# Patient Record
Sex: Male | Born: 1980 | Race: White | Hispanic: No | Marital: Single
Health system: Southern US, Community
[De-identification: ages and names within clinical notes are randomized; demographics above are authoritative.]

---

## 2003-08-17 ENCOUNTER — Encounter: Admission: RE | Admit: 2003-08-17 | Discharge: 2003-08-17 | Payer: Self-pay | Admitting: Family Medicine

## 2004-08-06 ENCOUNTER — Encounter: Admission: RE | Admit: 2004-08-06 | Discharge: 2004-08-06 | Payer: Self-pay | Admitting: Family Medicine

## 2006-03-10 IMAGING — CR DG FOOT COMPLETE 3+V*R*
3 series · 3 of 3 positions shown · non-contrast
Comparison: none

CLINICAL DATA: Pain in right foot.  Status post injury.  Swelling over the 3rd, 4th, and 5th metatarsals. 
 RIGHT FOOT COMPLETE: 
 Three views of the right foot show no definite fracture, dislocation, or foreign body.  There is soft tissue swelling seen over the region of the 4th and 5th mid and distal metatarsals.

[view not recorded (1 of 3)]
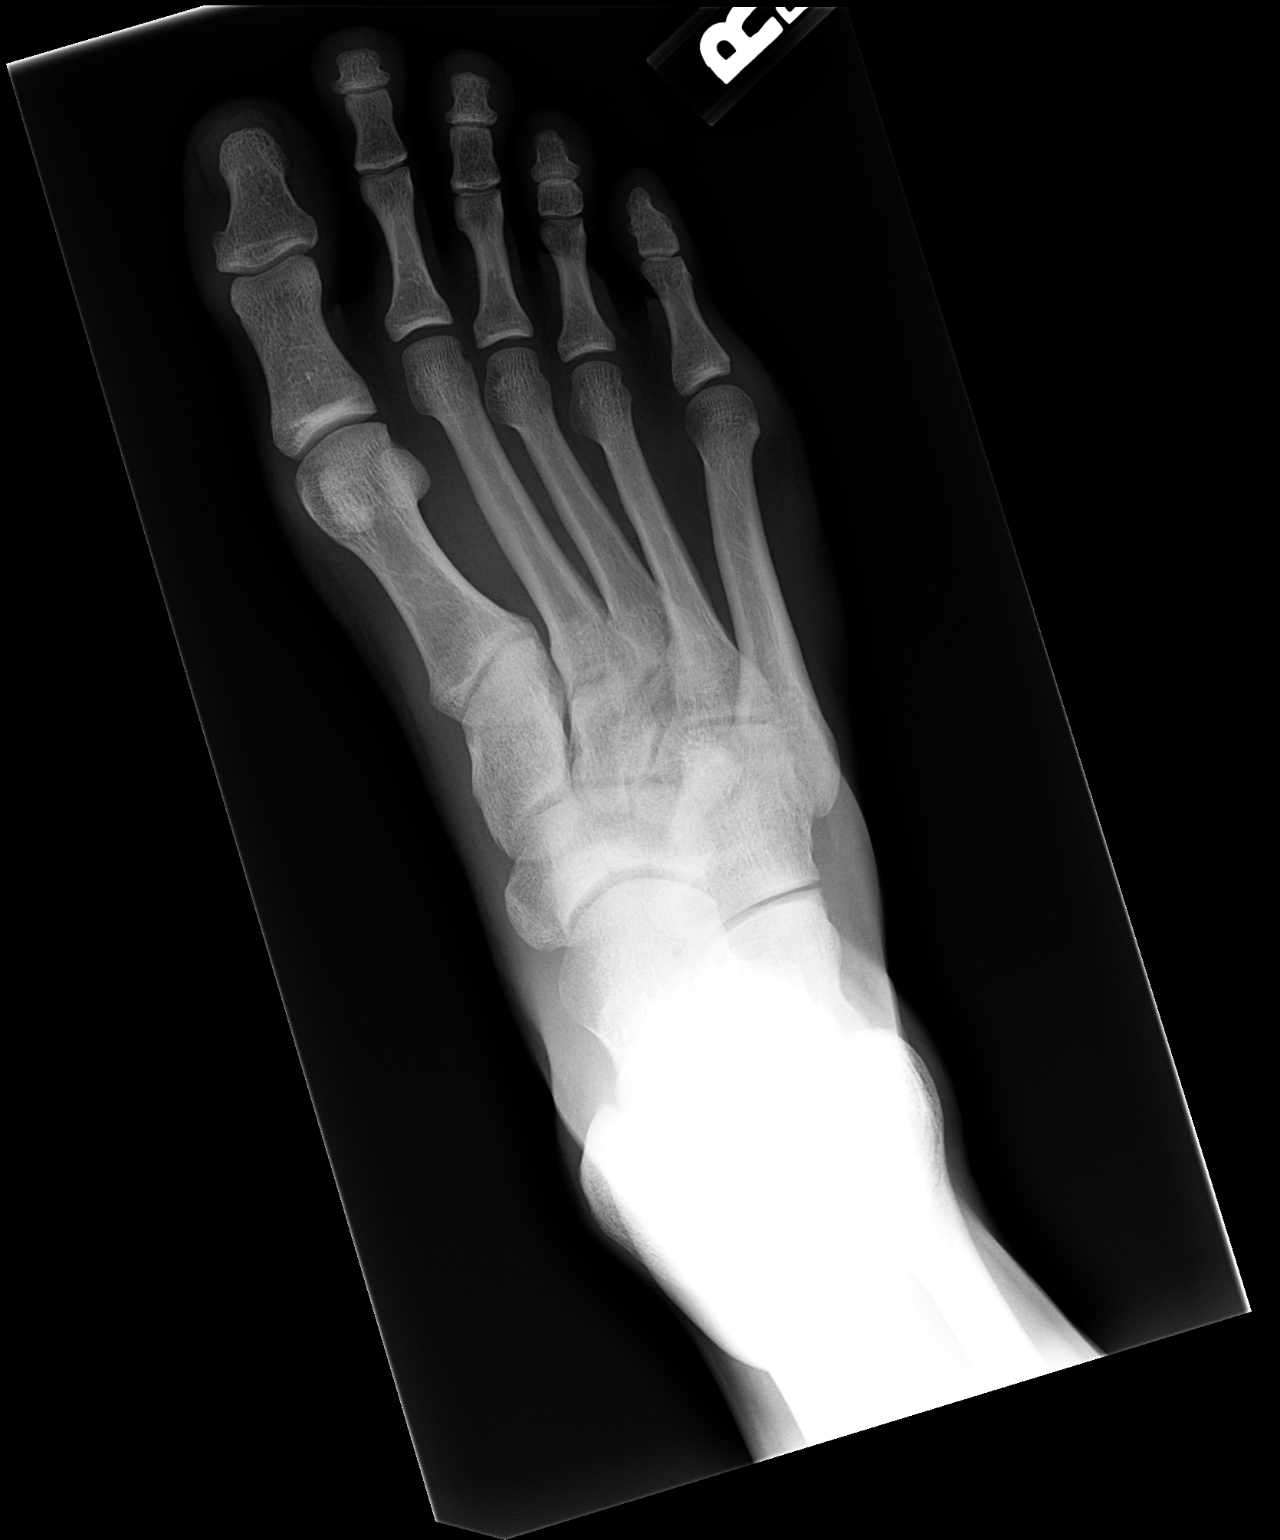

[view not recorded (2 of 3)]
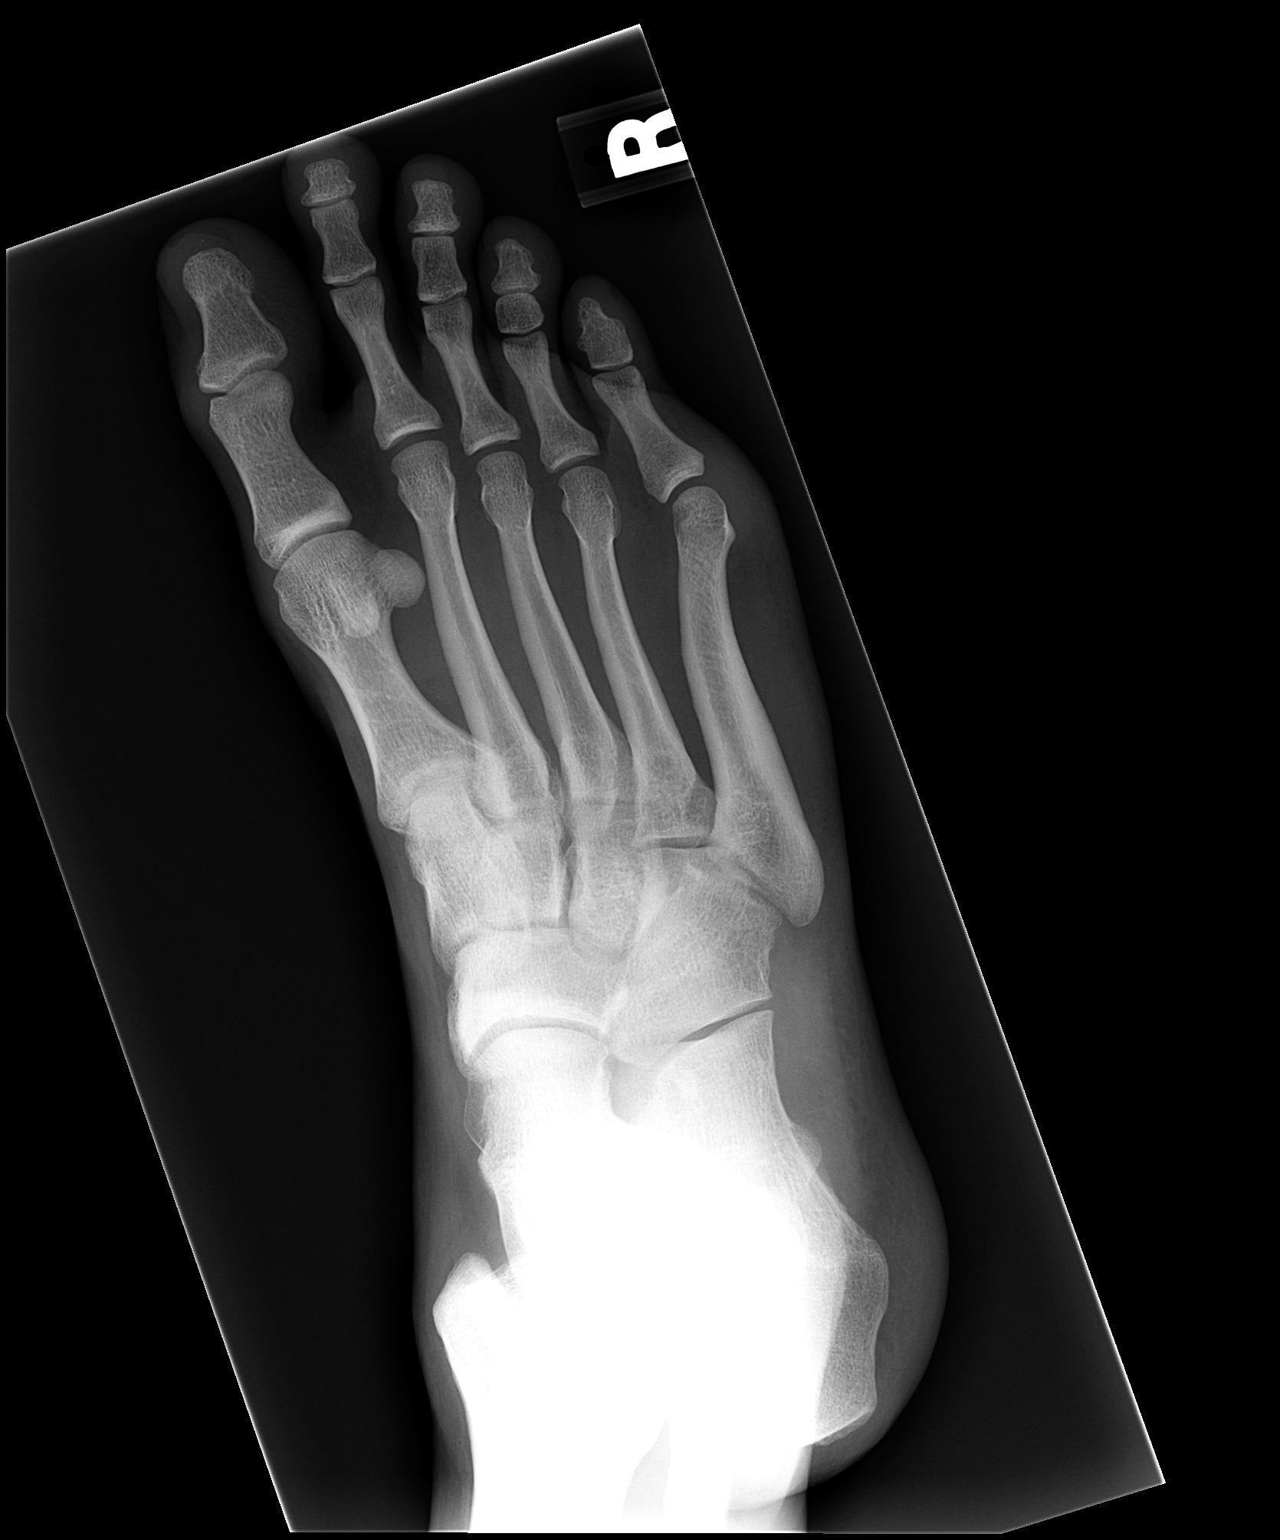

[view not recorded (3 of 3)]
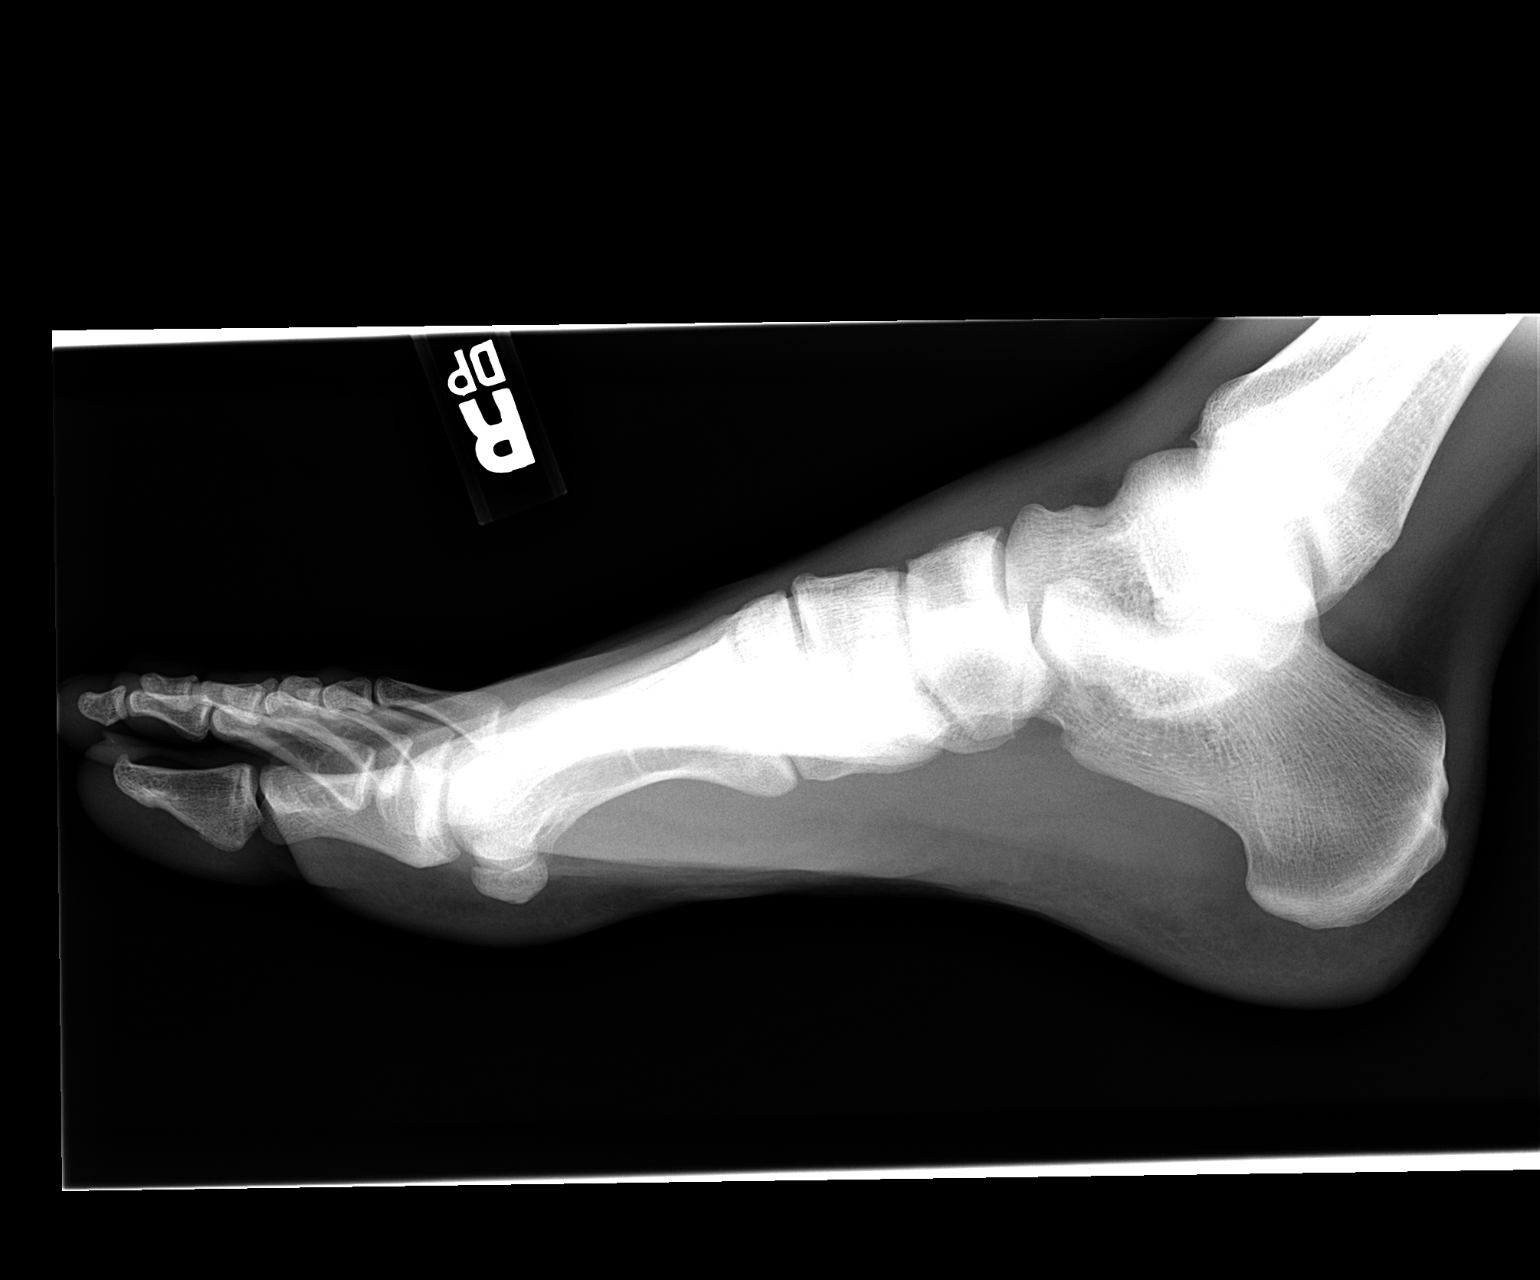

[3 of 3 positions shown; findings below may reference images not displayed]

IMPRESSION: No fracture, dislocation, or foreign body.  Soft tissue swelling is noted over the 4th and 5th metatarsals.

## 2007-03-13 ENCOUNTER — Emergency Department (HOSPITAL_COMMUNITY): Admission: EM | Admit: 2007-03-13 | Discharge: 2007-03-13 | Payer: Self-pay | Admitting: Family Medicine

## 2008-10-14 IMAGING — CR DG ANKLE COMPLETE 3+V*R*
3 series · 3 of 3 positions shown · non-contrast
Comparison: none

CLINICAL DATA: Trauma, fall.

RIGHT ANKLE - 3 VIEW

[view not recorded (1 of 3)]
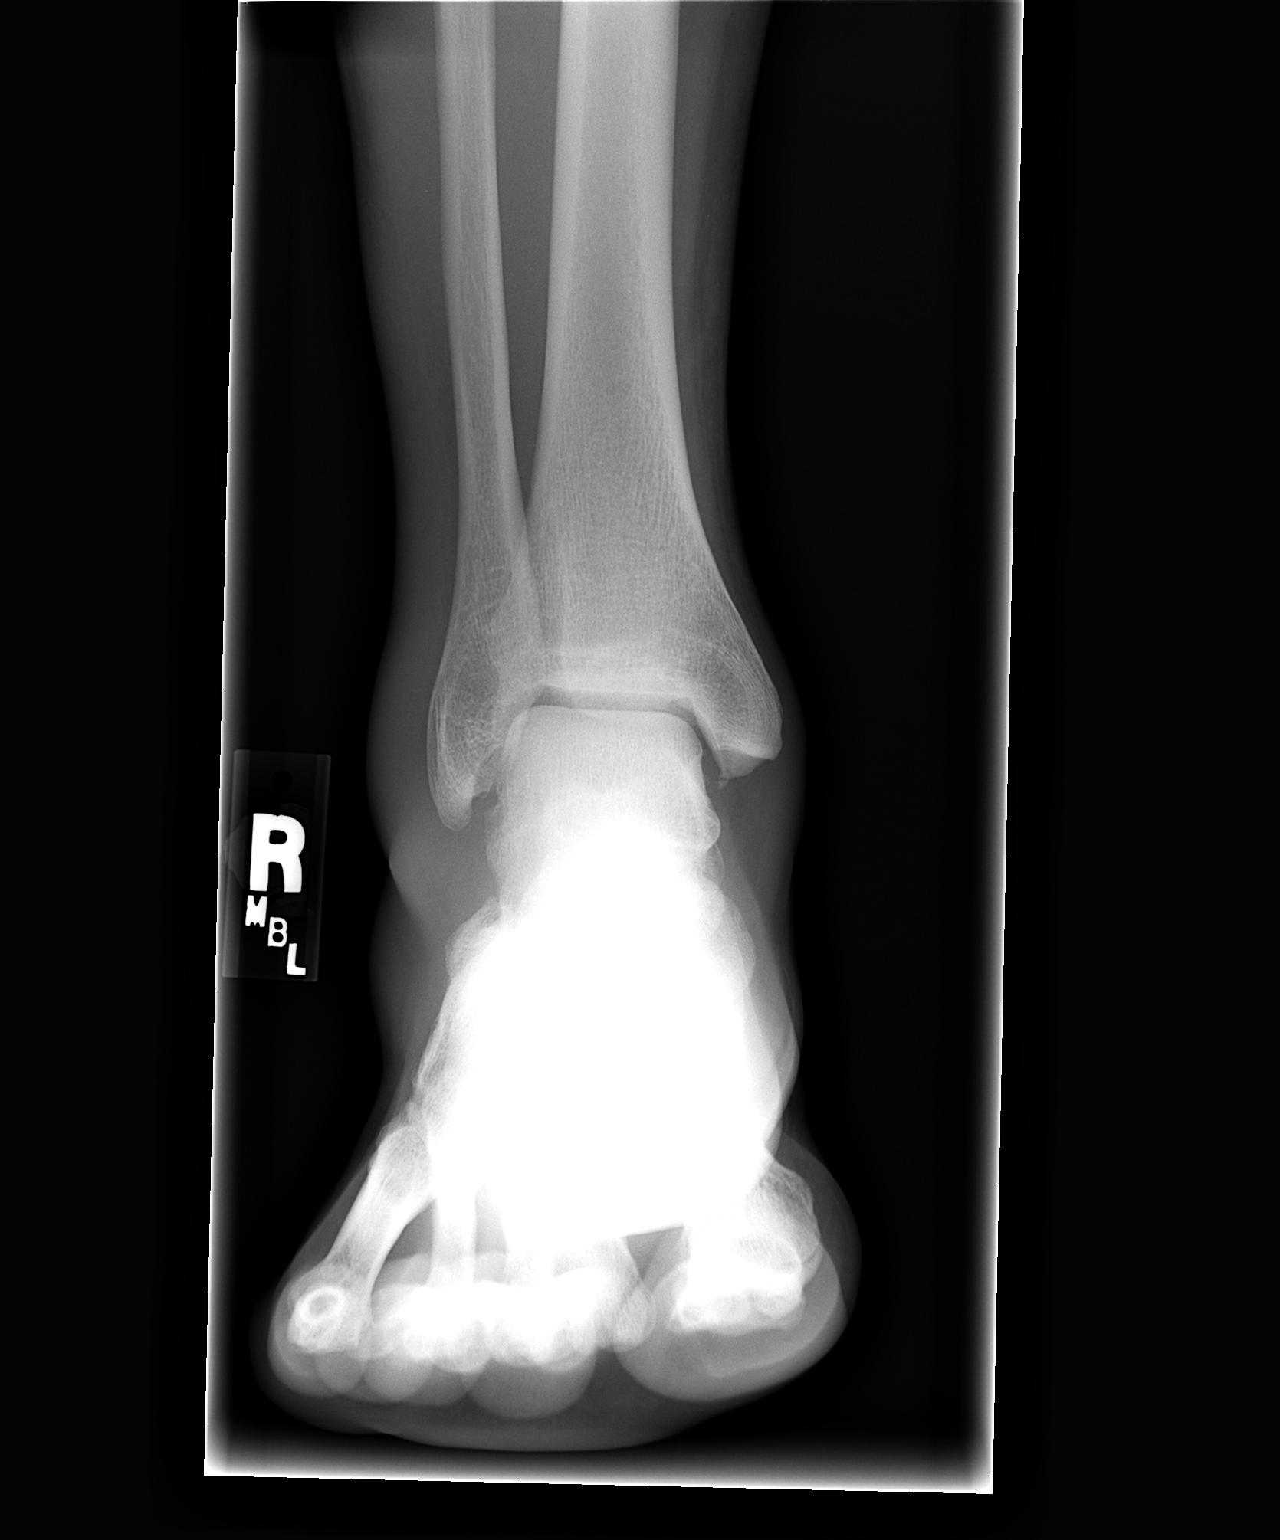

[view not recorded (2 of 3)]
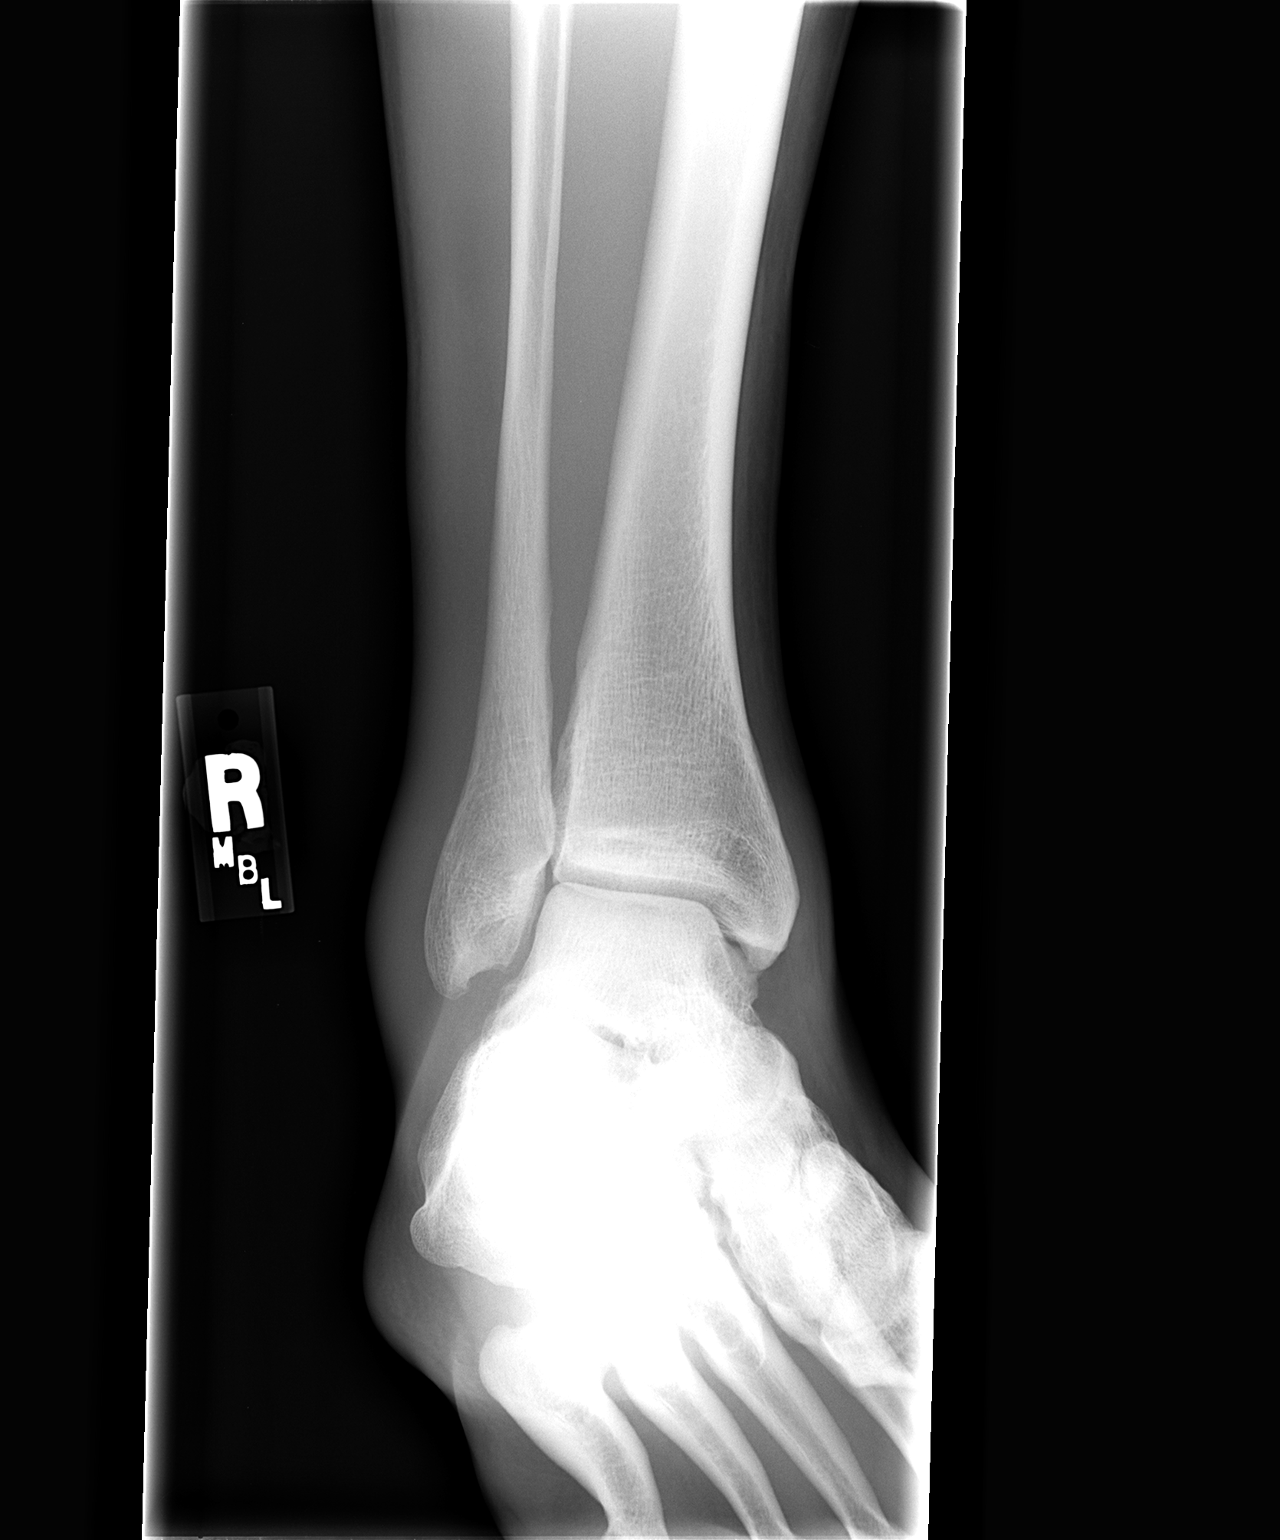

[view not recorded (3 of 3)]
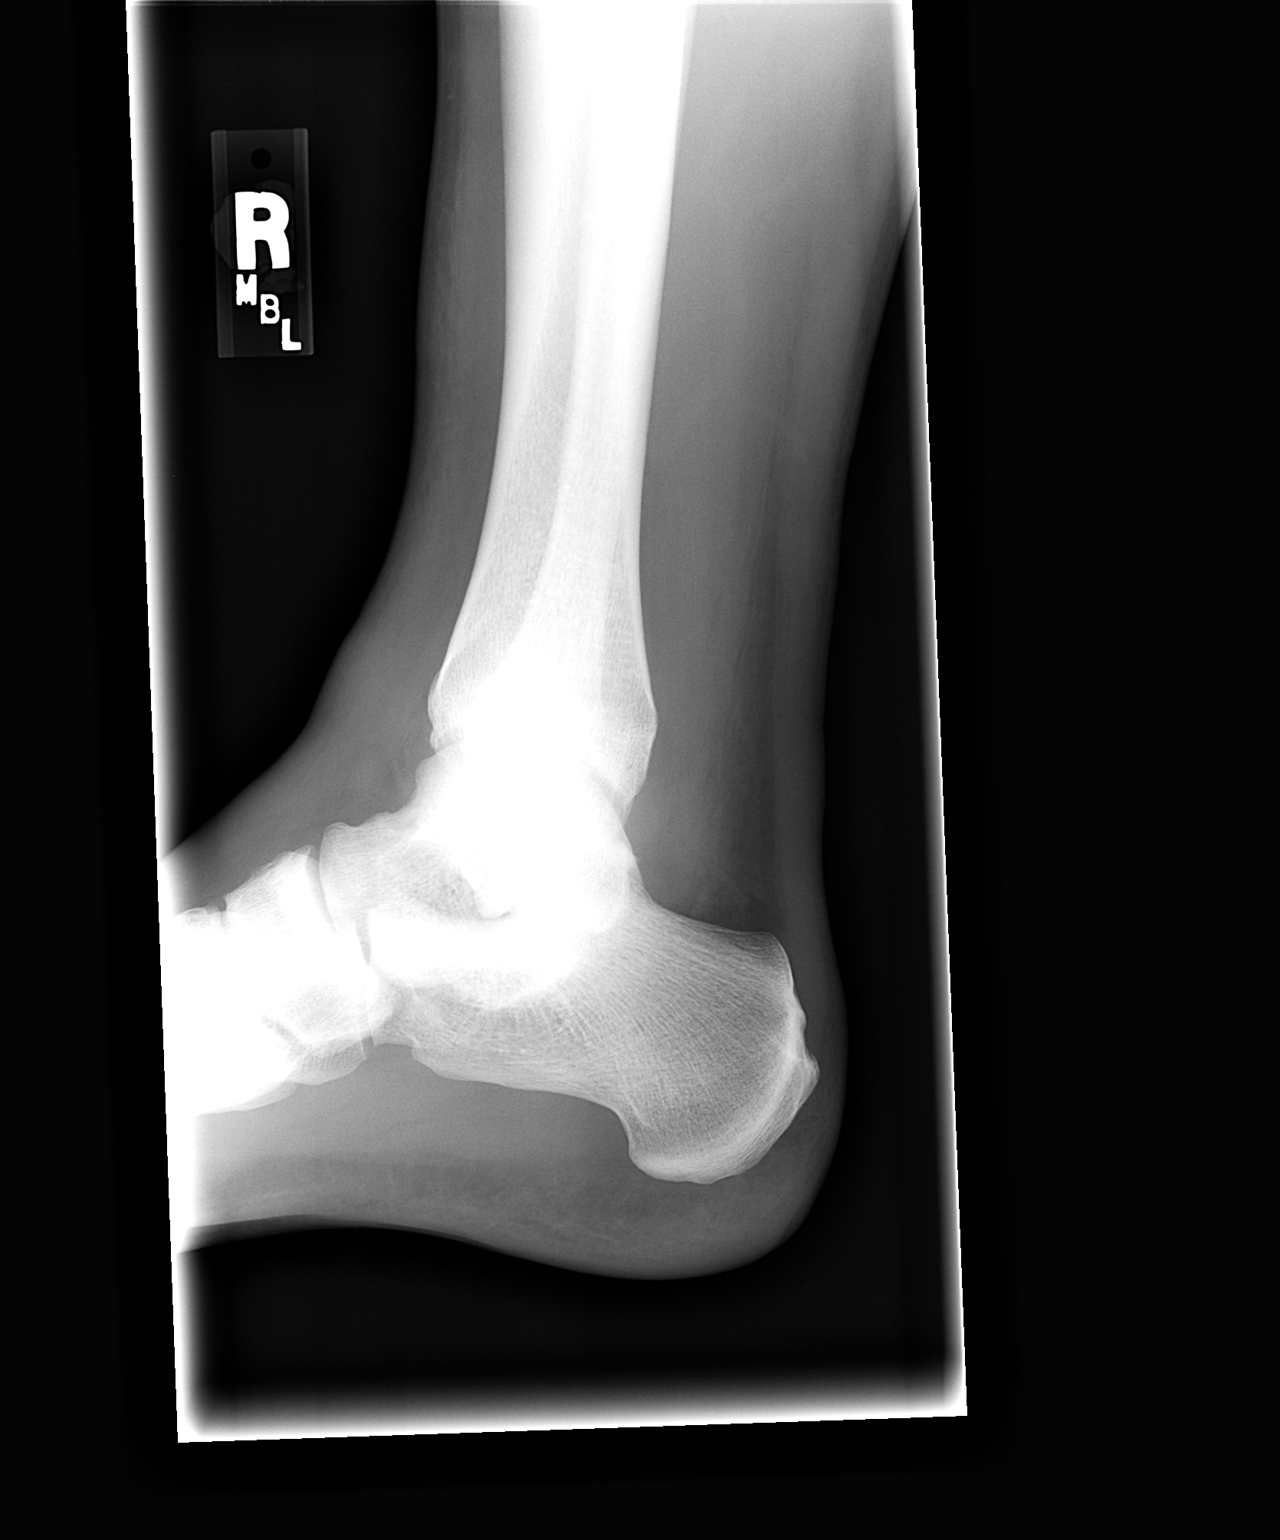

[3 of 3 positions shown; findings below may reference images not displayed]

FINDINGS: Marked lateral malleolar soft tissue swelling. No fibular or lateral
malleolar fracture. Avulsion fragment present off tip of medial malleolus, with
mild soft tissue swelling.

IMPRESSION
Medial malleolar tip tiny avulsion fragment, consistent with ankle sprain.

## 2020-12-07 ENCOUNTER — Encounter: Payer: Self-pay | Admitting: Sports Medicine

## 2020-12-07 ENCOUNTER — Ambulatory Visit: Payer: BC Managed Care – PPO | Admitting: Sports Medicine

## 2020-12-07 ENCOUNTER — Other Ambulatory Visit: Payer: Self-pay

## 2020-12-07 DIAGNOSIS — M79671 Pain in right foot: Secondary | ICD-10-CM | POA: Diagnosis not present

## 2020-12-07 DIAGNOSIS — M79672 Pain in left foot: Secondary | ICD-10-CM | POA: Diagnosis not present

## 2020-12-07 DIAGNOSIS — B070 Plantar wart: Secondary | ICD-10-CM

## 2020-12-07 NOTE — Progress Notes (Signed)
Subjective: Todd Watts is a 40 y.o. male patient who presents to office for evaluation of Right> Left foot pain secondary to moderately painful wart at the right plantar greater than left foot.  Patient has tried over-the-counter cushions with no relief in symptoms. Patient denies any other pedal complaints.   Patient is assisted by wife this visit.  There are no problems to display for this patient.   No current outpatient medications on file prior to visit.   No current facility-administered medications on file prior to visit.    Not on File  Objective:  General: Alert and oriented x3 in no acute distress  Dermatology: Keratotic lesion present measuring <less than 0.5 cm at right plantar lateral foot and left plantar forefoot with no skin lines transversing the lesion, pain is present with medial lateral pressure to the lesions, capillaries with pin point bleeding noted, no webspace macerations, no ecchymosis bilateral, all nails x 10 are well manicured.  Vascular: Dorsalis Pedis and Posterior Tibial pedal pulses 2/4, Capillary Fill Time 3 seconds, + pedal hair growth bilateral, no edema bilateral lower extremities, Temperature gradient within normal limits.  Neurology: Gross sensation intact via light touch bilateral.  Musculoskeletal: Mild tenderness with palpation at the lesion site on Right>Left, Muscular strength 5/5 in all groups without pain or limitation on range of motion. No lower extremity muscular or boney deformity noted.  Assessment and Plan: Problem List Items Addressed This Visit   None Visit Diagnoses     Plantar wart of both feet    -  Primary   Foot pain, bilateral           -Complete examination performed -Discussed treatment options for wart(s) -Parred keratoic warty lesions x3 using a chisel blade; treated the area withSalinocaine and Band-Aid dressing since he did not have Cantherone available.  Advised patient of possible skin peeling reaction  and advised him when this happens to apply Neosporin and Band-Aid -May use pumice stone for exfoliation -Encourage good hygiene habits -Patient to return to office as scheduled in few weeks for wart follow-up or sooner if condition worsens.  Asencion Islam, DPM

## 2020-12-07 NOTE — Patient Instructions (Signed)
To your warts change your bandaids each day When you see peeling apply neosporin and may use pumice stone to help exfoliate any loose skin May use water shoes or liquid bandaid when at [employer]

## 2021-01-04 ENCOUNTER — Encounter: Payer: Self-pay | Admitting: Sports Medicine

## 2021-01-04 ENCOUNTER — Other Ambulatory Visit: Payer: Self-pay

## 2021-01-04 ENCOUNTER — Ambulatory Visit: Payer: BC Managed Care – PPO | Admitting: Sports Medicine

## 2021-01-04 DIAGNOSIS — M79671 Pain in right foot: Secondary | ICD-10-CM

## 2021-01-04 DIAGNOSIS — B070 Plantar wart: Secondary | ICD-10-CM | POA: Diagnosis not present

## 2021-01-04 DIAGNOSIS — M79672 Pain in left foot: Secondary | ICD-10-CM

## 2021-01-04 NOTE — Progress Notes (Signed)
Subjective: Todd Watts is a 40 y.o. male patient who returns to office for  follow up evaluation of Right> Left foot pain secondary to moderately painful wart.  Patient had salinocane applied last visit and reports to peeling and a little soreness. No other issues noted.   There are no problems to display for this patient.   No current outpatient medications on file prior to visit.   No current facility-administered medications on file prior to visit.    Not on File  Objective:  General: Alert and oriented x3 in no acute distress  Dermatology: Keratotic lesion present measuring <less than 0.5 cm at right plantar lateral foot and left plantar forefoot with no skin lines transversing the lesion, pain is present with medial lateral pressure to the lesions, capillaries with pin point bleeding noted, no webspace macerations, no ecchymosis bilateral, all nails x 10 are well manicured.  Vascular: Dorsalis Pedis and Posterior Tibial pedal pulses 2/4, Capillary Fill Time 3 seconds, + pedal hair growth bilateral, no edema bilateral lower extremities, Temperature gradient within normal limits.  Neurology: Gross sensation intact via light touch bilateral.  Musculoskeletal: Mild tenderness with palpation at the lesion site on Right>Left, Muscular strength 5/5 in all groups without pain or limitation on range of motion. No lower extremity muscular or boney deformity noted.  Assessment and Plan: Problem List Items Addressed This Visit   None Visit Diagnoses     Plantar wart of both feet    -  Primary   Foot pain, bilateral           -Complete examination performed -Discussed treatment options for wart(s) -Parred keratoic warty lesions x3 using a chisel blade; treated the area with Cantherone, treatment #1 .  Advised patient of possible skin peeling  and blisterreaction and advised him when this happens to apply Neosporin and Band-Aid -May use pumice stone for exfoliation as needed -May  use epsom salt if sore -Encourage good hygiene habits -Patient to return to office as scheduled in 3-4 weeks for wart follow-up or sooner if condition worsens.  Asencion Islam, DPM

## 2021-02-01 ENCOUNTER — Ambulatory Visit: Payer: BC Managed Care – PPO | Admitting: Sports Medicine

## 2021-02-08 ENCOUNTER — Ambulatory Visit: Payer: BC Managed Care – PPO | Admitting: Sports Medicine

## 2021-03-08 ENCOUNTER — Ambulatory Visit: Payer: BC Managed Care – PPO | Admitting: Sports Medicine
